# Patient Record
Sex: Male | Born: 1975 | Hispanic: Yes | Marital: Married | Smoking: Never smoker
Health system: Southern US, Community
[De-identification: ages and names within clinical notes are randomized; demographics above are authoritative.]

---

## 2019-02-01 ENCOUNTER — Other Ambulatory Visit: Payer: Self-pay

## 2019-02-01 ENCOUNTER — Emergency Department
Admission: EM | Admit: 2019-02-01 | Discharge: 2019-02-02 | Disposition: A | Discharge status: Home / Self Care | Attending: Emergency Medicine | Admitting: Emergency Medicine

## 2019-02-01 ENCOUNTER — Emergency Department

## 2019-02-01 ENCOUNTER — Encounter: Payer: Self-pay | Admitting: Emergency Medicine

## 2019-02-01 DIAGNOSIS — S0181XA Laceration without foreign body of other part of head, initial encounter: Secondary | ICD-10-CM

## 2019-02-01 DIAGNOSIS — Y9389 Activity, other specified: Secondary | ICD-10-CM | POA: Diagnosis not present

## 2019-02-01 DIAGNOSIS — S0121XA Laceration without foreign body of nose, initial encounter: Secondary | ICD-10-CM | POA: Insufficient documentation

## 2019-02-01 DIAGNOSIS — Y9259 Other trade areas as the place of occurrence of the external cause: Secondary | ICD-10-CM | POA: Diagnosis not present

## 2019-02-01 DIAGNOSIS — Y990 Civilian activity done for income or pay: Secondary | ICD-10-CM | POA: Insufficient documentation

## 2019-02-01 DIAGNOSIS — W228XXA Striking against or struck by other objects, initial encounter: Secondary | ICD-10-CM | POA: Diagnosis not present

## 2019-02-01 DIAGNOSIS — S0083XA Contusion of other part of head, initial encounter: Secondary | ICD-10-CM

## 2019-02-01 MED ORDER — LIDOCAINE-EPINEPHRINE-TETRACAINE (LET) SOLUTION: 3.0000 mL | Freq: Once | NASAL | Status: DC

## 2019-02-01 MED ORDER — IBUPROFEN 800 MG PO TABS
800.0000 mg | ORAL_TABLET | Freq: Once | ORAL | Status: AC
  Administered 2019-02-02: 800 mg via ORAL
  Filled 2019-02-01: qty 1

## 2019-02-01 NOTE — ED Provider Notes (Signed)
[facility] EMERGENCY DEPARTMENT Provider Note   CSN: [id number] Arrival date & time: 02/01/19  2211     History   Chief Complaint Chief Complaint  Patient presents with  . Laceration    HPI Carlos Barrett is a 43 y.o. male presents to the emergency department for evaluation of Worker's Comp. injury.  Injury occurred just prior to arrival.  Patient states he went to close a door, shut the door, door bounced open with the handle hitting him in the face.  He suffered a laceration to the right side of the bridge of the nose.  Patient denies any loss of consciousness, nausea or vomiting.  He did have a slight right-sided epistasis which resolved on its own within a few minutes.  He denies any vision changes, eye pain.  He denies falling or causing any other injury to his body.  Pain is 8 out of 10.  He has not had a medications for pain.  Tetanus is up-to-date.     HPI  History reviewed. No pertinent past medical history.  There are no active problems to display for this patient.   History reviewed. No pertinent surgical history.      Home Medications    Prior to Admission medications   Not on File    Family History No family history on file.  Social History Social History   Tobacco Use  . Smoking status: Never Smoker  . Smokeless tobacco: Never Used  Substance Use Topics  . Alcohol use: Not on file  . Drug use: Not on file     Allergies   Patient has no known allergies.   Review of Systems Review of Systems  Constitutional: Negative.  Negative for activity change, appetite change, chills and fever.  HENT: Positive for facial swelling. Negative for congestion, ear pain, mouth sores, rhinorrhea, sinus pressure, sore throat and trouble swallowing.   Eyes: Negative for photophobia, pain and discharge.  Respiratory: Negative for shortness of breath.   Cardiovascular: Negative for chest pain and leg swelling.  Gastrointestinal: Negative  for abdominal distention, abdominal pain, diarrhea, nausea and vomiting.  Genitourinary: Negative for difficulty urinating and dysuria.  Musculoskeletal: Negative for arthralgias, back pain and gait problem.  Skin: Positive for wound. Negative for color change and rash.  Neurological: Negative for dizziness and headaches.  Hematological: Negative for adenopathy.  Psychiatric/Behavioral: Negative for agitation and behavioral problems.     Physical Exam Updated Vital Signs BP (!) 177/89 (BP Location: Left Arm)   Pulse 65   Temp 98 F (36.7 C) (Oral)   Resp 18   Wt 59.4 kg   SpO2 99%   Physical Exam Vitals signs and nursing note reviewed.  Constitutional:      General: He is not in acute distress.    Appearance: Normal appearance.     Comments: Ambulatory with no antalgic gait, no assistive devices.  HENT:     Head: Normocephalic.     Comments: Laceration to the right side of the bridge of the nose, 2.5 cm, linear not extending into the upper or lower eyelid.  No visible or palpable foreign body.    Right Ear: Ear canal and external ear normal.     Left Ear: Ear canal and external ear normal.  Eyes:     Extraocular Movements: Extraocular movements intact.     Conjunctiva/sclera: Conjunctivae normal.     Pupils: Pupils are equal, round, and reactive to light.  Neck:     Musculoskeletal:  Normal range of motion.  Cardiovascular:     Rate and Rhythm: Normal rate.  Pulmonary:     Effort: Pulmonary effort is normal.  Musculoskeletal: Normal range of motion.  Neurological:     General: No focal deficit present.     Mental Status: He is alert. Mental status is at baseline.     Cranial Nerves: No cranial nerve deficit.  Psychiatric:        Mood and Affect: Mood normal.        Thought Content: Thought content normal.        Judgment: Judgment normal.      ED Treatments / Results  Labs (all labs ordered are listed, but only abnormal results are displayed) Labs Reviewed - No  data to display  EKG None  Radiology No results found.  Procedures .Marland Kitchen.Laceration Repair  Date/Time: 02/01/2019 11:33 PM Performed by: Evon SlackGaines, Aundre Hietala C, PA-C Authorized by: Evon SlackGaines, Yolonda Purtle C, PA-C   Consent:    Consent obtained:  Verbal   Consent given by:  Patient   Risks discussed:  Infection and pain   Alternatives discussed:  No treatment Anesthesia (see MAR for exact dosages):    Anesthesia method:  None Laceration details:    Location:  Face   Face location:  Nose   Length (cm):  2   Depth (mm):  1 Repair type:    Repair type:  Simple Exploration:    Contaminated: no   Treatment:    Area cleansed with:  Betadine and saline   Amount of cleaning:  Standard   Visualized foreign bodies/material removed: no   Skin repair:    Repair method:  Tissue adhesive Post-procedure details:    Dressing:  Open (no dressing)   (including critical care time)  Medications Ordered in ED Medications  lidocaine-EPINEPHrine-tetracaine (LET) solution (has no administration in time range)  ibuprofen (ADVIL) tablet 800 mg (has no administration in time range)     Initial Impression / Assessment and Plan / ED Course  I have reviewed the triage vital signs and the nursing notes.  Pertinent labs & imaging results that were available during my care of the patient were reviewed by me and considered in my medical decision making (see chart for details).        43 year old male with laceration to the right side of the bridge of the nose.  X-ray showed no evidence of acute bony abnormality.  Epistasis resolved.  Tetanus up-to-date.  Laceration repaired with Dermabond after thorough irrigation.  Ibuprofen as needed for pain.  Follow-up with PCP in 1 week for recheck.  He understands signs and symptoms to return to ED for.  Final Clinical Impressions(s) / ED Diagnoses   Final diagnoses:  Facial laceration, initial encounter  Facial hematoma, initial encounter    ED Discharge Orders     None       Ronnette JuniperGaines, Berkleigh Beckles C, PA-C 02/01/19 2349    Dionne BucySiadecki, Sebastian, MD 03/12/19 2151

## 2019-02-01 NOTE — Discharge Instructions (Addendum)
Please keep laceration site clean and dry.  Return to the ER for any increasing pain, swelling, warmth redness or fevers.  Follow-up with PCP in 7 to 10 days for recheck.  Alternate Tylenol and ibuprofen as needed for pain.

## 2019-02-01 NOTE — ED Triage Notes (Addendum)
Patient ambulatory to triage with steady gait, without difficulty or distress noted; pt reports employed with [employer] (workers comp profile indicates UDS required); PTA door handle hit him; lac to rt side bride of nose with scant bleeding; denies LOC

## 2020-02-03 DIAGNOSIS — K2100 Gastro-esophageal reflux disease with esophagitis, without bleeding: Secondary | ICD-10-CM | POA: Insufficient documentation

## 2020-02-21 ENCOUNTER — Encounter: Payer: Self-pay | Admitting: Urology

## 2020-03-01 ENCOUNTER — Ambulatory Visit (INDEPENDENT_AMBULATORY_CARE_PROVIDER_SITE_OTHER): Payer: Self-pay | Admitting: Urology

## 2020-03-01 ENCOUNTER — Other Ambulatory Visit: Payer: Self-pay

## 2020-03-01 ENCOUNTER — Encounter: Payer: Self-pay | Admitting: Urology

## 2020-03-01 VITALS — BP 143/89 | HR 70 | Ht 61.0 in | Wt 140.2 lb

## 2020-03-01 DIAGNOSIS — N50812 Left testicular pain: Secondary | ICD-10-CM

## 2020-03-01 DIAGNOSIS — N453 Epididymo-orchitis: Secondary | ICD-10-CM

## 2020-03-01 MED ORDER — SULFAMETHOXAZOLE-TRIMETHOPRIM 800-160 MG PO TABS: 1.0000 | ORAL_TABLET | Freq: Two times a day (BID) | ORAL | 0 refills | Status: AC

## 2020-03-01 MED ORDER — CELECOXIB 200 MG PO CAPS: 200.0000 mg | ORAL_CAPSULE | Freq: Every day | ORAL | 0 refills | Status: AC

## 2020-03-01 NOTE — Progress Notes (Signed)
   03/01/20 10:06 AM   Carlos Barrett Jun 02, 1976 [id number]  CC: Left scrotal pain, urinary symptoms  HPI: I saw Carlos Barrett in urology clinic for the above issues.  He is a 44 year old male who reports 6 years of intermittent scrotal pain after undergoing a vasectomy.  He has noticed worsening left-sided scrotal pain over the last few weeks, as well as some urinary symptoms of urgency and frequency.  He works as a Naval architect, and sitting for prolonged period makes his scrotal pain worse.  There are no alleviating factors.  He denies any gross hematuria or flank pain.  He was evaluated by his PCP and STD testing was negative.  Urinalysis today is benign.    Social History:  reports that he has never smoked. He has never used smokeless tobacco. He reports previous alcohol use. He reports that he does not use drugs.  Physical Exam: BP (!) 143/89 (BP Location: Left Arm, Patient Position: Sitting, Cuff Size: Normal)   Pulse 70   Ht 5\' 1"  (1.549 m)   Wt 140 lb 3.2 oz (63.6 kg)   BMI 26.49 kg/m    Constitutional:  Alert and oriented, No acute distress. Cardiovascular: No clubbing, cyanosis, or edema. Respiratory: Normal respiratory effort, no increased work of breathing. GI: Abdomen is soft, nontender, nondistended, no abdominal masses GU: Phallus with patent meatus, testicles 20 cc and descended bilaterally, left epididymis swollen and extremely tender  Laboratory Data: Reviewed see HPI  Pertinent Imaging: None to review  Assessment & Plan:   He is a 44 year old male with a history of chronic scrotal pain since vasectomy, worsening over the few weeks and localizing to the left epididymis.  On exam, he has distinct point tenderness over the left epididymis worrisome for epididymitis.  We reviewed behavioral strategies at length.  I recommended a course of Bactrim DS twice daily 10 days, as well as 2 weeks of Celebrex.  I also recommended a scrotal ultrasound, and close  follow-up in 4 weeks to confirm symptom resolution.  Bactrim DS twice daily x10 days Celebrex daily x14 days Call with scrotal ultrasound results 1 month follow-up  53, MD 03/01/2020  [facility] [address], [address] [place], [place] [phone]

## 2020-03-01 NOTE — Patient Instructions (Addendum)
Epididymitis  La epididimitis es la hinchazn (inflamacin) o infeccin del epiddimo. El epiddimo es [place] estructura similar a una cuerda que se encuentra a lo largo de la regin posterosuperior del testculo. Recolecta y almacena el semen del testculo. Esta afeccin tambin puede causar dolor e hinchazn en el testculo y el escroto. Generalmente, los sntomas comienzan de [place] repentina (epididimitis [place]). A veces, la epididimitis empieza de manera gradual y dura algn tiempo (epididimitis crnica). Este tipo puede ser ms difcil de tratar. Cules son las causas? En los hombres de [place] 20 y 40 aos, esta afeccin suele deberse a una infeccin bacteriana o a una enfermedad de transmisin sexual (ETS), por ejemplo:  Gonorrea.  Clamidia. En los hombres [employer] de 40 aos que no tienen sexo anal, la causa de esta afeccin suelen ser las bacterias de una obstruccin o las anormalidades en el sistema urinario. Estas pueden ser consecuencia de lo siguiente:  Winferd Humphrey sonda en la vejiga (catter urinario).  Tener la prstata agrandada o inflamada.  Haberse sometido recientemente a [place] de las vas [place].  Tener un problema con el flujo de la orina que vuelve (retrgrado). En los hombres que padecen una enfermedad que debilita el sistema de defensa (sistema inmunitario) del organismo, como el VIH, las causas de esta afeccin pueden ser las siguientes:  Otras bacterias, incluidas la tuberculosis y la sfilis.  Virus.  Hongos. En ocasiones, esta afeccin se presenta sin infeccin. Esto puede ocurrir debido a un traumatismo o a actividades repetitivas, como los deportes. Qu incrementa el riesgo? Es ms probable que tenga esta afeccin si presenta:  Relaciones sexuales sin proteccin con ms de Medical laboratory scientific officer.  Sexo anal.  Se someti a una ciruga recientemente.  Un catter urinario.  Problemas urinarios.  Sistema inmunitario deprimido. Cules son los signos o los  sntomas? Generalmente, esta afeccin se manifiesta sbitamente con escalofros, fiebre, dolor detrs del escroto y en el testculo. Otros sntomas pueden incluir los siguientes:  Hinchazn del escroto, el testculo o ambos.  Dolor al eyacular o al Beatrix Shipper.  Dolor en la espalda o el abdomen.  Nuseas.  Picazn y secrecin por el pene.  Necesidad frecuente de Geographical information systems officer.  Enrojecimiento, aumento del calor y Engineer, mining a la palpacin en el escroto. Cmo se diagnostica? El mdico puede diagnosticar esta afeccin en funcin de los sntomas y los antecedentes mdicos. El mdico Location manager un examen fsico para hacerle preguntas sobre los sntomas y revisarle tanto el escroto como los testculos a fin de Engineer, manufacturing signos de hinchazn, dolor y enrojecimiento. Tambin pueden hacerle otras pruebas, incluidas las siguientes:  Anlisis de la secrecin que emana del pene.  Anlisis de orina para detectar infecciones, como las ETS.  Ecografa para ver el flujo sanguneo y la inflamacin. El mdico puede hacerle pruebas de deteccin de otras enfermedades de transmisin sexual (ETS), incluido el VIH. Cmo se trata? El tratamiento de esta afeccin depende de la causa. Si la afeccin se debe a una infeccin bacteriana, pueden recetarle antibiticos por va oral. Si la infeccin bacteriana se ha diseminado a la [place], tal vez deba recibir antibiticos intravenosos. Para la epididimitis bacteriana y no [place], le pueden dar el siguiente tratamiento:  Reposo.  Elevacin del escroto.  Analgsicos.  Medicamentos antiinflamatorios. Es posible que necesite una ciruga para tratar lo siguiente:  La epididimitis bacteriana que produce la acumulacin de pus en el escroto (absceso).  La epididimitis crnica que no ha respondido a otros tratamientos. Siga estas instrucciones en su casa: Medicamentos  Tome los medicamentos de venta libre y los recetados solamente como se lo haya indicado el  mdico.  Si le recetaron un antibitico, tmelo como se lo haya indicado el mdico. No deje de tomar o usar el antibitico aunque la afeccin mejore. Actividad sexual  Si la causa de la epididimitis es una ETS, evite la actividad sexual hasta haber completado el [place].  Si el resultado del estudio de una ETS fue positivo, informe a sus Chief Strategy Officer. Tal vez deban recibir tratamiento. No tenga actividad sexual con sus parejas sexuales hasta haber completado el tratamiento. Control del dolor y de la hinchazn   Si se lo indican, levante el escroto y aplique hielo. ? Ponga el hielo en una bolsa plstica. ? Coloque una toalla pequea o una almohada entre las piernas. ? Apoye el escroto sobre la almohada o [place]. ? Coloque otra toalla entre la piel y la bolsa de plstico. ? Coloque el hielo durante 20 minutos, 2 a 3 veces por da.  Intente darse un bao de asiento para [employer]. Se trata de un bao de agua tibia que se toma mientras se est sentado. El agua solo debe Adult nurse las caderas y cubrir las nalgas. Hgalo 3 o 4 veces al da o como se lo haya indicado el mdico.  Mantenga el escroto elevado y bien sujeto mientras hace reposo. Pregntele al mdico si debe usar un dispositivo de sujecin del escroto, como un suspensorio. selo como se lo haya indicado el mdico. Instrucciones generales  Retome sus actividades normales segn lo indicado por el mdico. Pregntele al mdico qu actividades son seguras para usted.  Beba suficiente lquido como para Pharmacologist la orina de color amarillo plido.  Concurra a todas las visitas de [address] se lo haya indicado el mdico. Esto es importante. Comunquese con un mdico si:  Tiene fiebre.  El medicamento no IT trainer.  El dolor [place].  Los sntomas no mejoran en el trmino de [address]. Resumen  La epididimitis es la hinchazn (inflamacin) o infeccin del epiddimo. Esta afeccin tambin puede  causar dolor e hinchazn en el testculo y el escroto.  El tratamiento de esta afeccin depende de la causa. Si la afeccin se debe a una infeccin bacteriana, pueden recetarle antibiticos por va oral.  Si el resultado del estudio de [place] ETS fue positivo, informe a sus Chief Strategy Officer. Tal vez deban recibir tratamiento. No tenga actividad sexual con sus parejas sexuales hasta haber completado el tratamiento.  Comunquese con un mdico si los sntomas no mejoran en el plazo de 3 das. Esta informacin no tiene Theme park manager el consejo del mdico. Asegrese de hacerle al mdico cualquier pregunta que tenga. Document Revised: 05/11/2018 Document Reviewed: 05/11/2018 Elsevier Patient Education  2020 Elsevier Inc.   Dolor plvico en hombres Pelvic Pain, Male El dolor plvico se siente en la parte inferior del abdomen, debajo del ombligo y a nivel de las caderas. El dolor puede comenzar en forma repentina (ser agudo), reaparecer (recurrente) o durar mucho tiempo (volverse crnico). El dolor plvico que dura ms de seis meses se considera crnico. Hay muchas causas posibles del dolor plvico. A veces, la causa es desconocida. El dolor plvico puede afectar:  La prstata.  El sistema urinario.  El tubo digestivo.  El sistema musculoesqueltico. Los msculos o ligamentos distendidos pueden causar dolor plvico. Siga estas indicaciones en su casa:  Medicamentos  Tome los medicamentos de venta libre y los recetados solamente como se lo haya  indicado el mdico.  Si le recetaron un antibitico, tmelo como se lo haya indicado el mdico. No deje de tomar el antibitico, aunque comience a sentirse mejor. Control del dolor, la rigidez y la [address] baos de agua caliente (baos de asiento). Los baos de asiento ayudan a International aid/development worker los msculos del suelo plvico. ? En un bao de asiento, el agua solamente llega [employer] caderas y [place] las nalgas. Un bao de asiento puede [employer] hogar en la baera o en una tina porttil para bao de asiento que se coloca sobre el inodoro.  Si se lo indican, aplique calor en la zona afectada antes de realizar ejercicios. Use la fuente de calor que el mdico le recomiende, como una compresa de calor hmedo o una almohadilla trmica. ? Coloque una [employer] piel y la fuente de Airline pilot. ? Aplique calor durante 20 a . ? Retire la fuente de calor si la piel se pone de color rojo brillante. Esto es especialmente importante si no puede sentir dolor, calor o fro. Puede correr un riesgo mayor de sufrir quemaduras. Indicaciones generales  Haga reposo como se lo haya indicado el mdico.  Lleve un registro del dolor plvico. Escriba los siguientes datos: ? Cundo Radiation protection practitioner. ? La ubicacin del dolor. ? Qu parece mejorar o Administrator, arts. ? Cualquier sntoma que se presente junto con Chief Technology Officer.  Siga el plan de tratamiento como se lo haya indicado el mdico. Esto puede incluir: ? Realizar fisioterapia plvica. ? Yoga, meditacin y [place] fsica. ? Biorretroalimentacin. Este proceso lo capacita para Scientist, physiological respuesta del cuerpo Publishing copy) a travs de tcnicas de respiracin y mtodos de relajacin. Usted trabajar con un terapeuta mientras se usan mquinas para controlar sus sntomas fsicos. ? Acupuntura. Este es un tipo de tratamiento en el que se estimulan puntos especficos del cuerpo con agujas delgadas que se introducen a travs de la piel para Corporate treasurer.  Concurra a todas las visitas de [address] se lo haya indicado el mdico. Esto es importante. Comunquese con un mdico si:  Los medicamentos no Tourist information centre manager.  El dolor regresa.  Aparecen nuevos sntomas.  Tiene fiebre o escalofros.  Tiene estreimiento.  Observa sangre en la orina o en las heces.  Se siente dbil o siente que va a desvanecerse. Solicite ayuda inmediatamente si:  Tiene un dolor repentino e  intenso.  El dolor empeora constantemente.  Siente dolor intenso junto con fiebre, nuseas, vmitos o sudoracin excesiva. Resumen  El dolor plvico se siente en la parte inferior del abdomen, debajo del ombligo y a nivel de las caderas. Hay muchas causas posibles del dolor plvico. A veces, la causa es desconocida.  Tome los medicamentos de venta libre y los recetados solamente como se lo haya indicado el mdico. Si le recetaron un antibitico, tmelo como se lo haya indicado el mdico. No deje de tomar el antibitico aunque comience a sentirse mejor.  Comunquese con un mdico si tiene sntomas nuevos o los sntomas empeoran.  Busque ayuda de inmediato si siente dolor intenso junto con fiebre, nuseas, vmitos o sudoracin excesiva.  Concurra a todas las visitas de [address] se lo haya indicado el mdico. Esto es importante. Esta informacin no tiene Theme park manager el consejo del mdico. Asegrese de hacerle al mdico cualquier pregunta que tenga. Document Revised: 12/05/2017 Document Reviewed: 12/05/2017 Elsevier Patient Education  2020 [employer].

## 2020-03-02 LAB — URINALYSIS, COMPLETE
Bilirubin, UA: NEGATIVE
Glucose, UA: NEGATIVE
Ketones, UA: NEGATIVE
Leukocytes,UA: NEGATIVE
Nitrite, UA: NEGATIVE
Protein,UA: NEGATIVE
RBC, UA: NEGATIVE
Specific Gravity, UA: 1.01 (ref 1.005–1.030)
Urobilinogen, Ur: 0.2 mg/dL (ref 0.2–1.0)
pH, UA: 6 (ref 5.0–7.5)

## 2020-03-02 LAB — MICROSCOPIC EXAMINATION
Bacteria, UA: NONE SEEN
RBC, Urine: NONE SEEN /hpf (ref 0–2)

## 2020-03-07 ENCOUNTER — Ambulatory Visit: Payer: Medicaid Other | Admitting: Urology

## 2020-03-30 ENCOUNTER — Ambulatory Visit: Admission: RE | Admit: 2020-03-30 | Payer: Self-pay | Source: Ambulatory Visit

## 2020-04-05 ENCOUNTER — Encounter: Payer: Self-pay | Admitting: Urology

## 2020-04-05 ENCOUNTER — Ambulatory Visit: Payer: Medicaid Other | Admitting: Urology

## 2021-03-13 IMAGING — CR NASAL BONES - 3+ VIEW
3 series · 3 of 3 positions shown · non-contrast
Comparison: None.

CLINICAL DATA: Laceration to right side of nose

EXAM:
NASAL BONES - 3+ VIEW

[nasal waters]
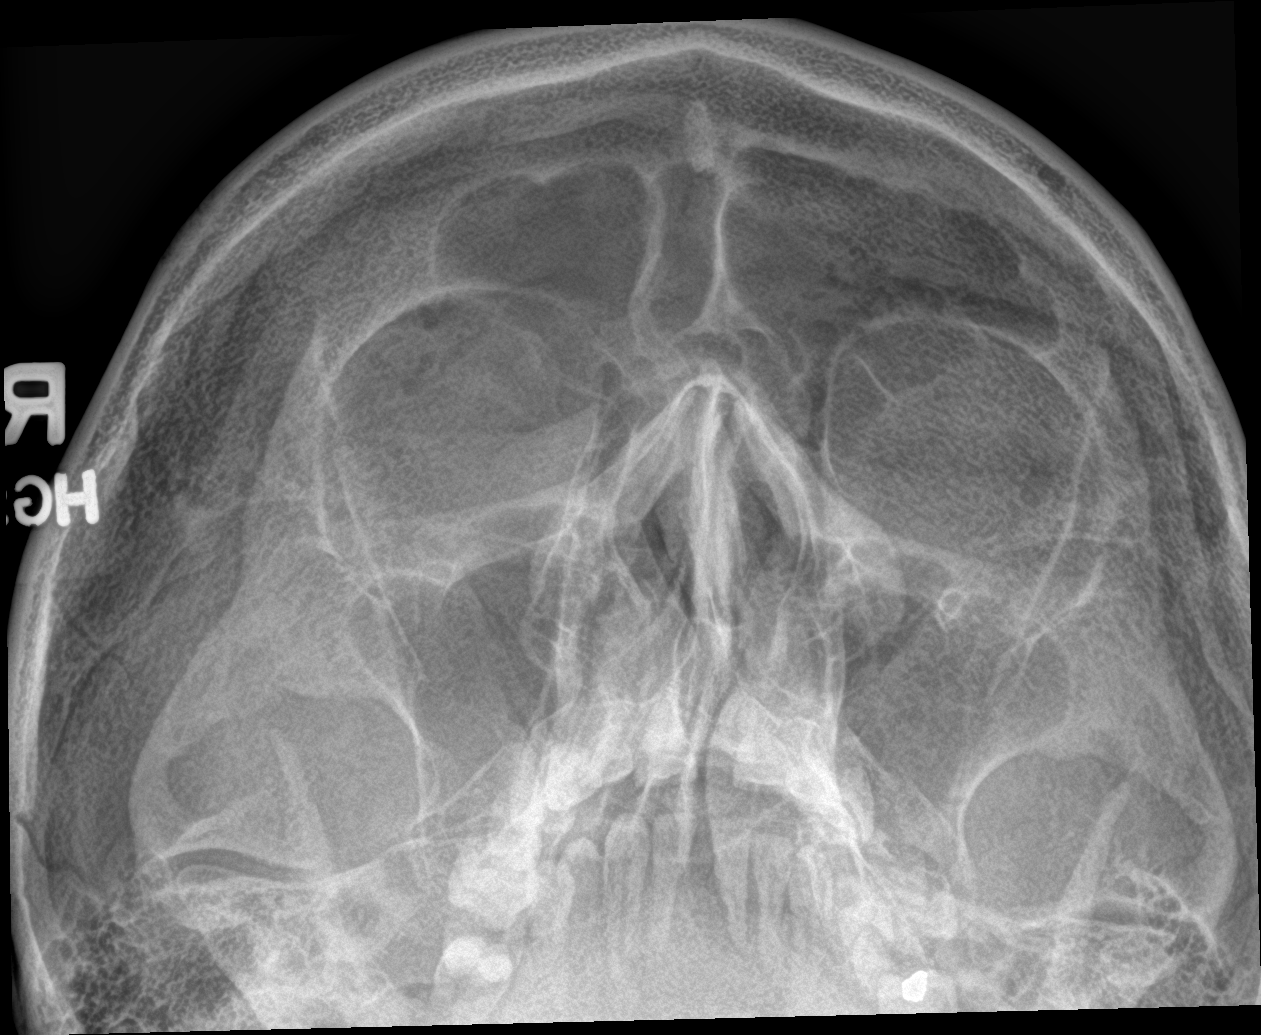

[nasal lat (1 of 2)]
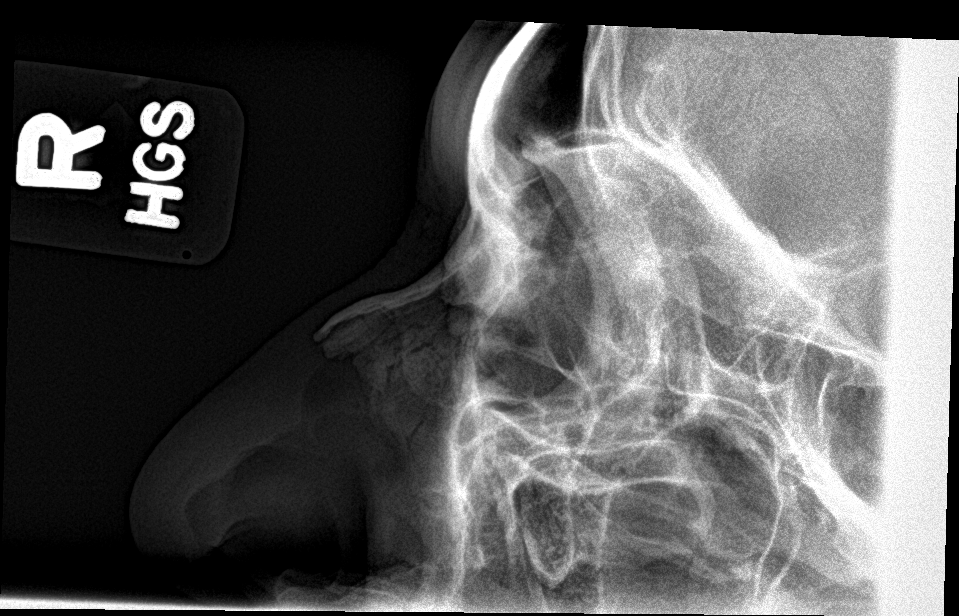

[nasal lat (2 of 2)]
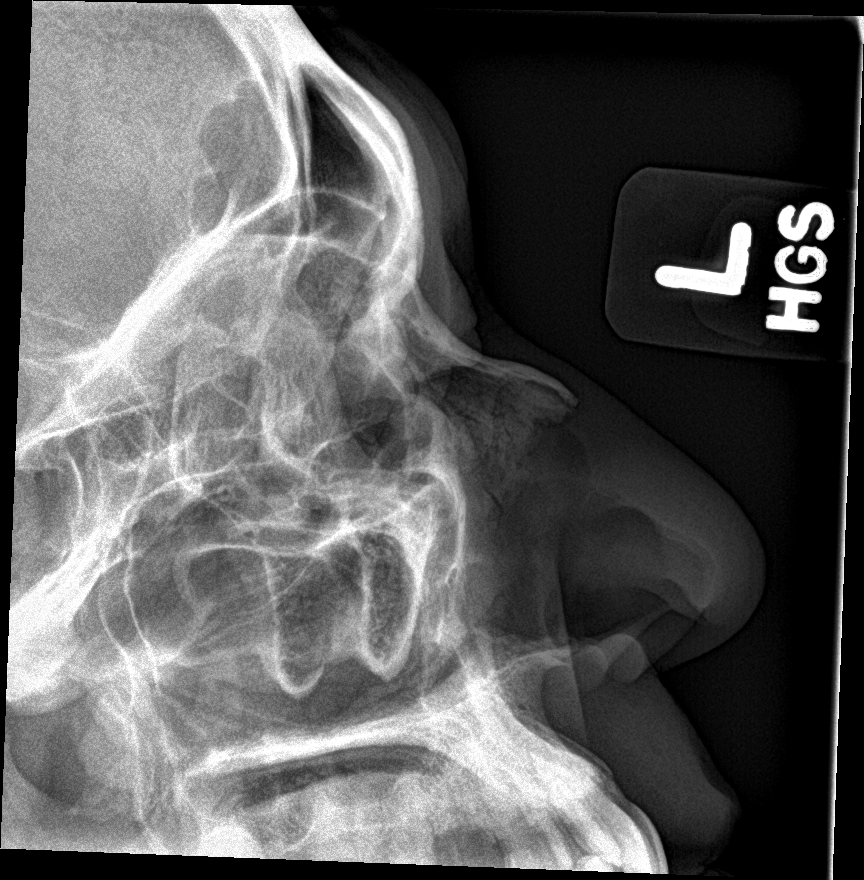

[3 of 3 positions shown; findings below may reference images not displayed]

FINDINGS: There is no evidence of fracture or other bone abnormality.
IMPRESSION: Negative.

## 2021-07-24 DIAGNOSIS — Z20822 Contact with and (suspected) exposure to covid-19: Secondary | ICD-10-CM | POA: Diagnosis not present

## 2021-07-24 DIAGNOSIS — J069 Acute upper respiratory infection, unspecified: Secondary | ICD-10-CM | POA: Diagnosis not present

## 2021-07-24 DIAGNOSIS — R059 Cough, unspecified: Secondary | ICD-10-CM | POA: Diagnosis not present

## 2022-05-10 DIAGNOSIS — Z419 Encounter for procedure for purposes other than remedying health state, unspecified: Secondary | ICD-10-CM | POA: Diagnosis not present

## 2022-06-10 DIAGNOSIS — Z419 Encounter for procedure for purposes other than remedying health state, unspecified: Secondary | ICD-10-CM | POA: Diagnosis not present

## 2022-07-11 DIAGNOSIS — Z419 Encounter for procedure for purposes other than remedying health state, unspecified: Secondary | ICD-10-CM | POA: Diagnosis not present

## 2022-08-09 DIAGNOSIS — Z419 Encounter for procedure for purposes other than remedying health state, unspecified: Secondary | ICD-10-CM | POA: Diagnosis not present

## 2022-09-09 DIAGNOSIS — Z419 Encounter for procedure for purposes other than remedying health state, unspecified: Secondary | ICD-10-CM | POA: Diagnosis not present

## 2022-10-09 DIAGNOSIS — Z419 Encounter for procedure for purposes other than remedying health state, unspecified: Secondary | ICD-10-CM | POA: Diagnosis not present

## 2022-11-09 DIAGNOSIS — Z419 Encounter for procedure for purposes other than remedying health state, unspecified: Secondary | ICD-10-CM | POA: Diagnosis not present

## 2022-12-09 DIAGNOSIS — Z419 Encounter for procedure for purposes other than remedying health state, unspecified: Secondary | ICD-10-CM | POA: Diagnosis not present

## 2023-01-09 DIAGNOSIS — Z419 Encounter for procedure for purposes other than remedying health state, unspecified: Secondary | ICD-10-CM | POA: Diagnosis not present

## 2023-02-09 DIAGNOSIS — Z419 Encounter for procedure for purposes other than remedying health state, unspecified: Secondary | ICD-10-CM | POA: Diagnosis not present

## 2023-03-11 DIAGNOSIS — Z419 Encounter for procedure for purposes other than remedying health state, unspecified: Secondary | ICD-10-CM | POA: Diagnosis not present

## 2023-04-11 DIAGNOSIS — Z419 Encounter for procedure for purposes other than remedying health state, unspecified: Secondary | ICD-10-CM | POA: Diagnosis not present

## 2023-05-11 DIAGNOSIS — Z419 Encounter for procedure for purposes other than remedying health state, unspecified: Secondary | ICD-10-CM | POA: Diagnosis not present

## 2023-06-11 DIAGNOSIS — Z419 Encounter for procedure for purposes other than remedying health state, unspecified: Secondary | ICD-10-CM | POA: Diagnosis not present

## 2023-07-12 DIAGNOSIS — Z419 Encounter for procedure for purposes other than remedying health state, unspecified: Secondary | ICD-10-CM | POA: Diagnosis not present

## 2023-08-09 DIAGNOSIS — Z419 Encounter for procedure for purposes other than remedying health state, unspecified: Secondary | ICD-10-CM | POA: Diagnosis not present

## 2023-09-20 DIAGNOSIS — Z419 Encounter for procedure for purposes other than remedying health state, unspecified: Secondary | ICD-10-CM | POA: Diagnosis not present

## 2023-10-20 DIAGNOSIS — Z419 Encounter for procedure for purposes other than remedying health state, unspecified: Secondary | ICD-10-CM | POA: Diagnosis not present
